# Patient Record
Sex: Male | Born: 1980 | Race: Black or African American | Hispanic: No | Marital: Single | Smoking: Light tobacco smoker
Health system: Southern US, Community
[De-identification: ages and names within clinical notes are randomized; demographics above are authoritative.]

---

## 2017-11-24 ENCOUNTER — Other Ambulatory Visit: Payer: Self-pay

## 2017-11-24 ENCOUNTER — Emergency Department: Payer: Self-pay

## 2017-11-24 ENCOUNTER — Encounter: Payer: Self-pay | Admitting: Emergency Medicine

## 2017-11-24 ENCOUNTER — Emergency Department
Admission: EM | Admit: 2017-11-24 | Discharge: 2017-11-24 | Disposition: A | Discharge status: Home / Self Care | Payer: Self-pay | Attending: Emergency Medicine | Admitting: Emergency Medicine

## 2017-11-24 DIAGNOSIS — F17200 Nicotine dependence, unspecified, uncomplicated: Secondary | ICD-10-CM | POA: Insufficient documentation

## 2017-11-24 DIAGNOSIS — R042 Hemoptysis: Secondary | ICD-10-CM | POA: Insufficient documentation

## 2017-11-24 LAB — URINALYSIS, COMPLETE (UACMP) WITH MICROSCOPIC
BACTERIA UA: NONE SEEN
Bilirubin Urine: NEGATIVE
Glucose, UA: NEGATIVE mg/dL
Hgb urine dipstick: NEGATIVE
Ketones, ur: NEGATIVE mg/dL
Leukocytes, UA: NEGATIVE
Nitrite: NEGATIVE
Protein, ur: NEGATIVE mg/dL
SPECIFIC GRAVITY, URINE: 1.018 (ref 1.005–1.030)
pH: 7 (ref 5.0–8.0)

## 2017-11-24 NOTE — ED Notes (Signed)
Patient returns from radiology to [facility].

## 2017-11-24 NOTE — ED Provider Notes (Signed)
[facility] Emergency Department Provider Note   ____________________________________________    I have reviewed the triage vital signs and the nursing notes.   HISTORY  Chief Complaint Cough and Hematuria     HPI Joe Wolfe is a 36 y.o. male who presents for reevaluation because on a routine physical recently it was noted that he had hematuria on urine dipstick and he was told to follow-up for this.  He is asymptomatic, no flank pain, no dysuria.   In addition he reports that he developed a cough this morning which has been productive and he had a small amount of blood mixed with phlegm after 1 cough.  Denies shortness of breath.  No calf pain swelling history of blood clots.  No recent travel.   History reviewed. No pertinent past medical history.  There are no active problems to display for this patient.   History reviewed. No pertinent surgical history.  Prior to Admission medications   Not on File     Allergies Patient has no known allergies.  No family history on file.  Social History Social History   Tobacco Use  . Smoking status: Light Tobacco Smoker  . Smokeless tobacco: Never Used  Substance Use Topics  . Alcohol use: Yes  . Drug use: No    Review of Systems  Constitutional: No fever/chills Eyes: No visual changes.  ENT: No sore throat. Cardiovascular: Denies chest pain. Respiratory: Denies shortness of breath.  Cough as above Gastrointestinal: No abdominal pain.   Genitourinary: Negative for dysuria. Musculoskeletal: Negative for back pain. Skin: Negative for rash. Neurological: Negative for headaches    ____________________________________________   PHYSICAL EXAM:  VITAL SIGNS: ED Triage Vitals  Enc Vitals Group     BP 11/24/17 1142 115/77     Pulse Rate 11/24/17 1142 70     Resp 11/24/17 1142 20     Temp 11/24/17 1142 97.7 F (36.5 C)     Temp Source 11/24/17 1142 Oral     SpO2 11/24/17  1142 98 %     Weight 11/24/17 1142 65.8 kg (145 lb)     Height --      Head Circumference --      Peak Flow --      Pain Score 11/24/17 1437 2     Pain Loc --      Pain Edu? --      Excl. in GC? --     Constitutional: Alert and oriented. No acute distress. Pleasant and interactive Eyes: Conjunctivae are normal.   Nose: No congestion/rhinnorhea. Mouth/Throat: Mucous membranes are moist.  Pharynx normal Cardiovascular: Normal rate, regular rhythm. Grossly normal heart sounds.  Good peripheral circulation. Respiratory: Normal respiratory effort.  No retractions. Lungs CTAB. Gastrointestinal: Soft and nontender. No distention.  No CVA tenderness. Genitourinary: deferred Musculoskeletal: No lower extremity tenderness nor edema.  Warm and well perfused Neurologic:  Normal speech and language. No gross focal neurologic deficits are appreciated.  Skin:  Skin is warm, dry and intact. No rash noted. Psychiatric: Mood and affect are normal. Speech and behavior are normal.  ____________________________________________   LABS (all labs ordered are listed, but only abnormal results are displayed)  Labs Reviewed  URINALYSIS, COMPLETE (UACMP) WITH MICROSCOPIC - Abnormal; Notable for the following components:      Result Value   Color, Urine YELLOW (*)    APPearance CLEAR (*)    Squamous Epithelial / LPF 0-5 (*)    All other components within normal  limits   ____________________________________________  EKG  None ____________________________________________  RADIOLOGY  Chest x-ray normal ____________________________________________   PROCEDURES  Procedure(s) performed: No  Procedures   Critical Care performed: No ____________________________________________   INITIAL IMPRESSION / ASSESSMENT AND PLAN / ED COURSE  Pertinent labs & imaging results that were available during my care of the patient were reviewed by me and considered in my medical decision making (see chart  for details).  Patient well-appearing in no acute distress.  Vital signs unremarkable.  Exam is reassuring.  No blood on urinalysis.  Chest x-ray is normal.  Suspect viral upper respiratory infection as the cause of his cough.  Recommend supportive care.  Outpatient follow-up as needed.  Return precautions discussed      ____________________________________________   FINAL CLINICAL IMPRESSION(S) / ED DIAGNOSES  Final diagnoses:  Cough with hemoptysis        Note:  This document was prepared using Dragon voice recognition software and may include unintentional dictation errors.    Jene EveryKinner, Gumecindo Hopkin, MD 11/24/17 1524

## 2017-11-24 NOTE — ED Notes (Signed)
Pt reports that he has blood in his urine since last Thursday  Pt reports that he started coughing up blood yesterday Pt reports cough, sore throat, denies fever

## 2017-11-24 NOTE — ED Triage Notes (Signed)
Pt wants to be seen for cough x4 days and blood in urine since last Thursday dx in [place], pt was told to follow up as soon as possible for blood in urine.

## 2017-11-24 NOTE — ED Notes (Signed)
To Radiology via [facility] 

## 2017-11-24 NOTE — ED Notes (Signed)
First Nurse Note:  Patient states he found out he had blood in his urine that was found last week during a physical.  States he began coughing up bright red blood yesterday.  Here to be seen for both.

## 2019-07-24 IMAGING — CR DG CHEST 2V
1 series · 2 of 2 positions shown · non-contrast
Comparison: None.

CLINICAL DATA: Cough

EXAM:
CHEST  2 VIEW

[Series 1: dg chest 2 view · 0.14mm/px · 2 of 2 slices shown]
[im 1/2]
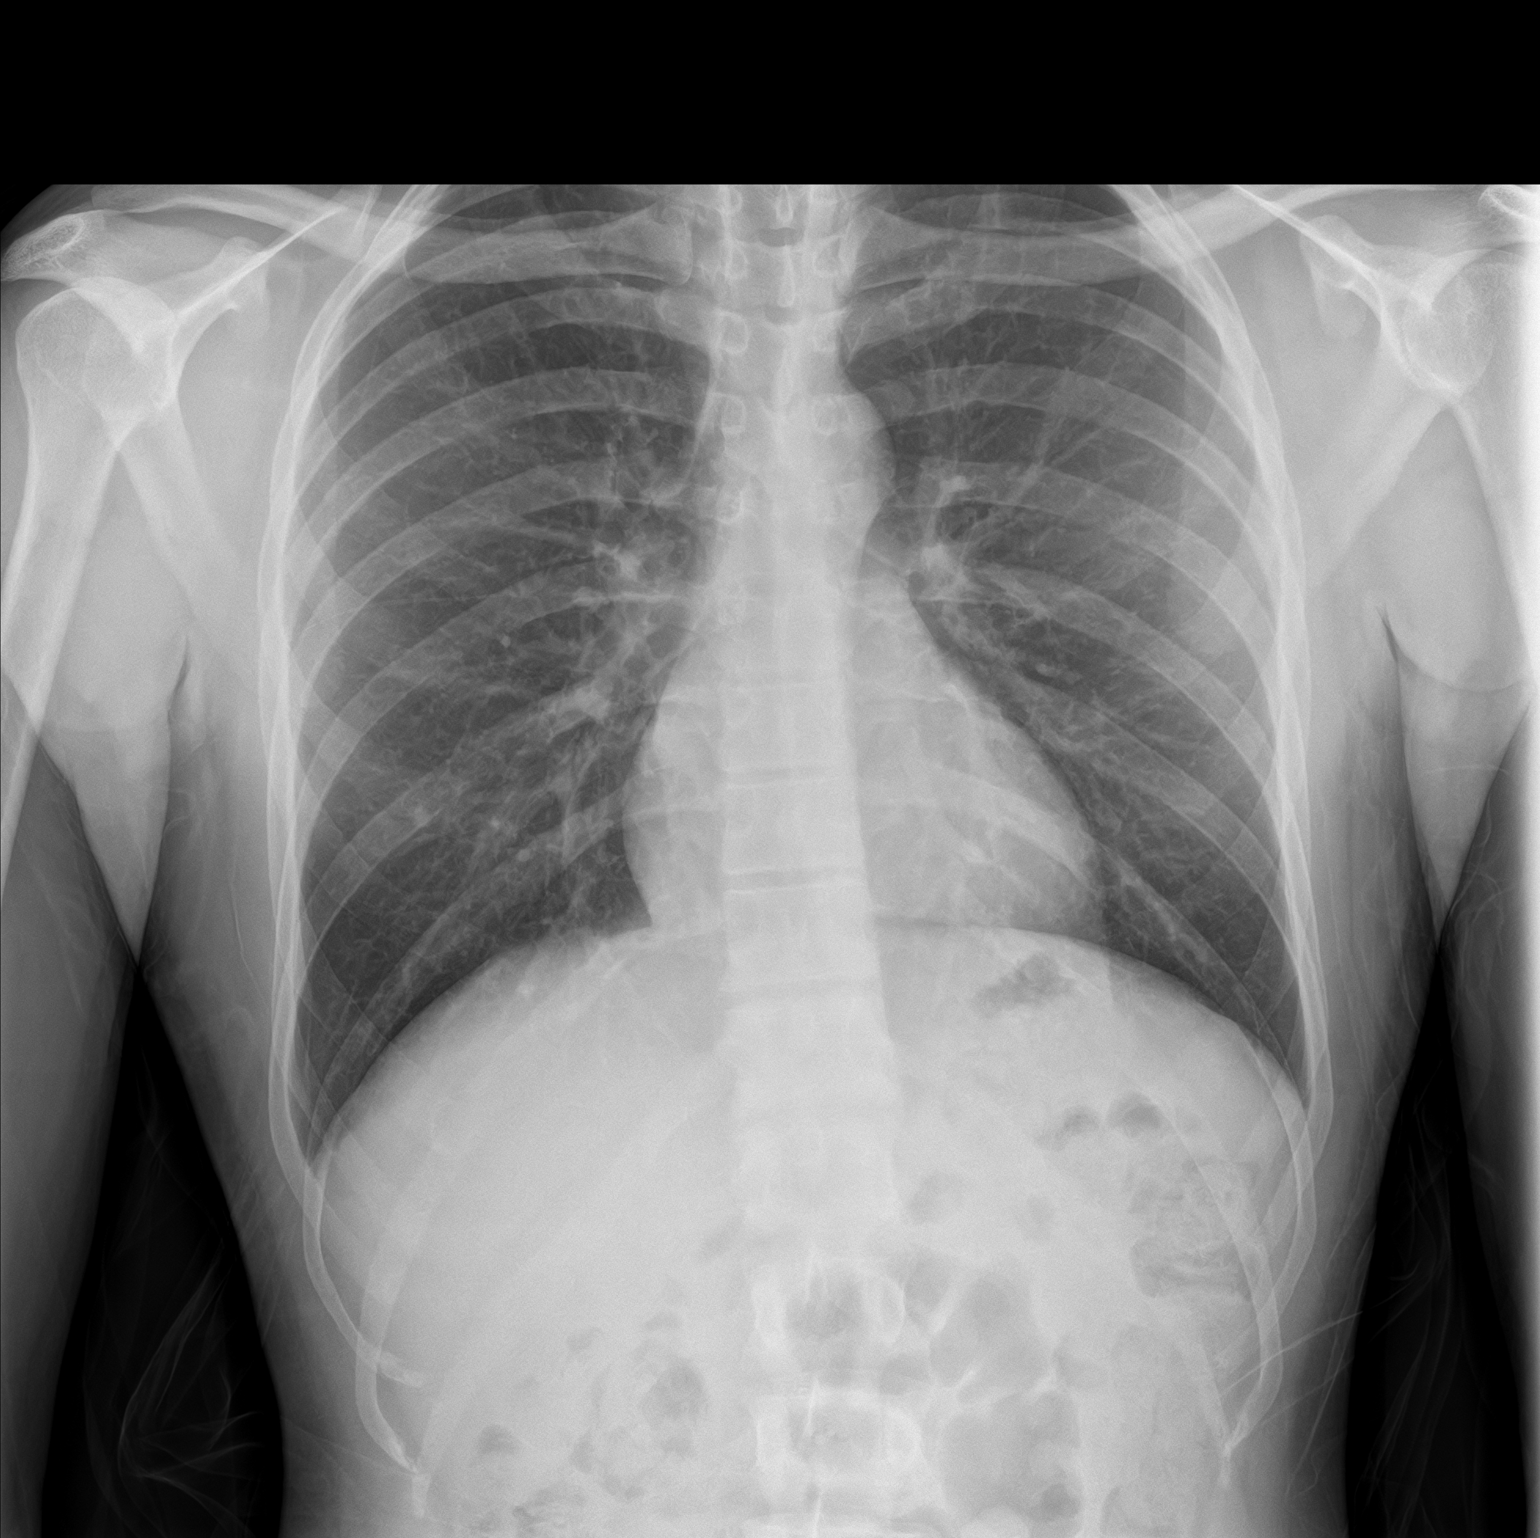
[im 2/2]
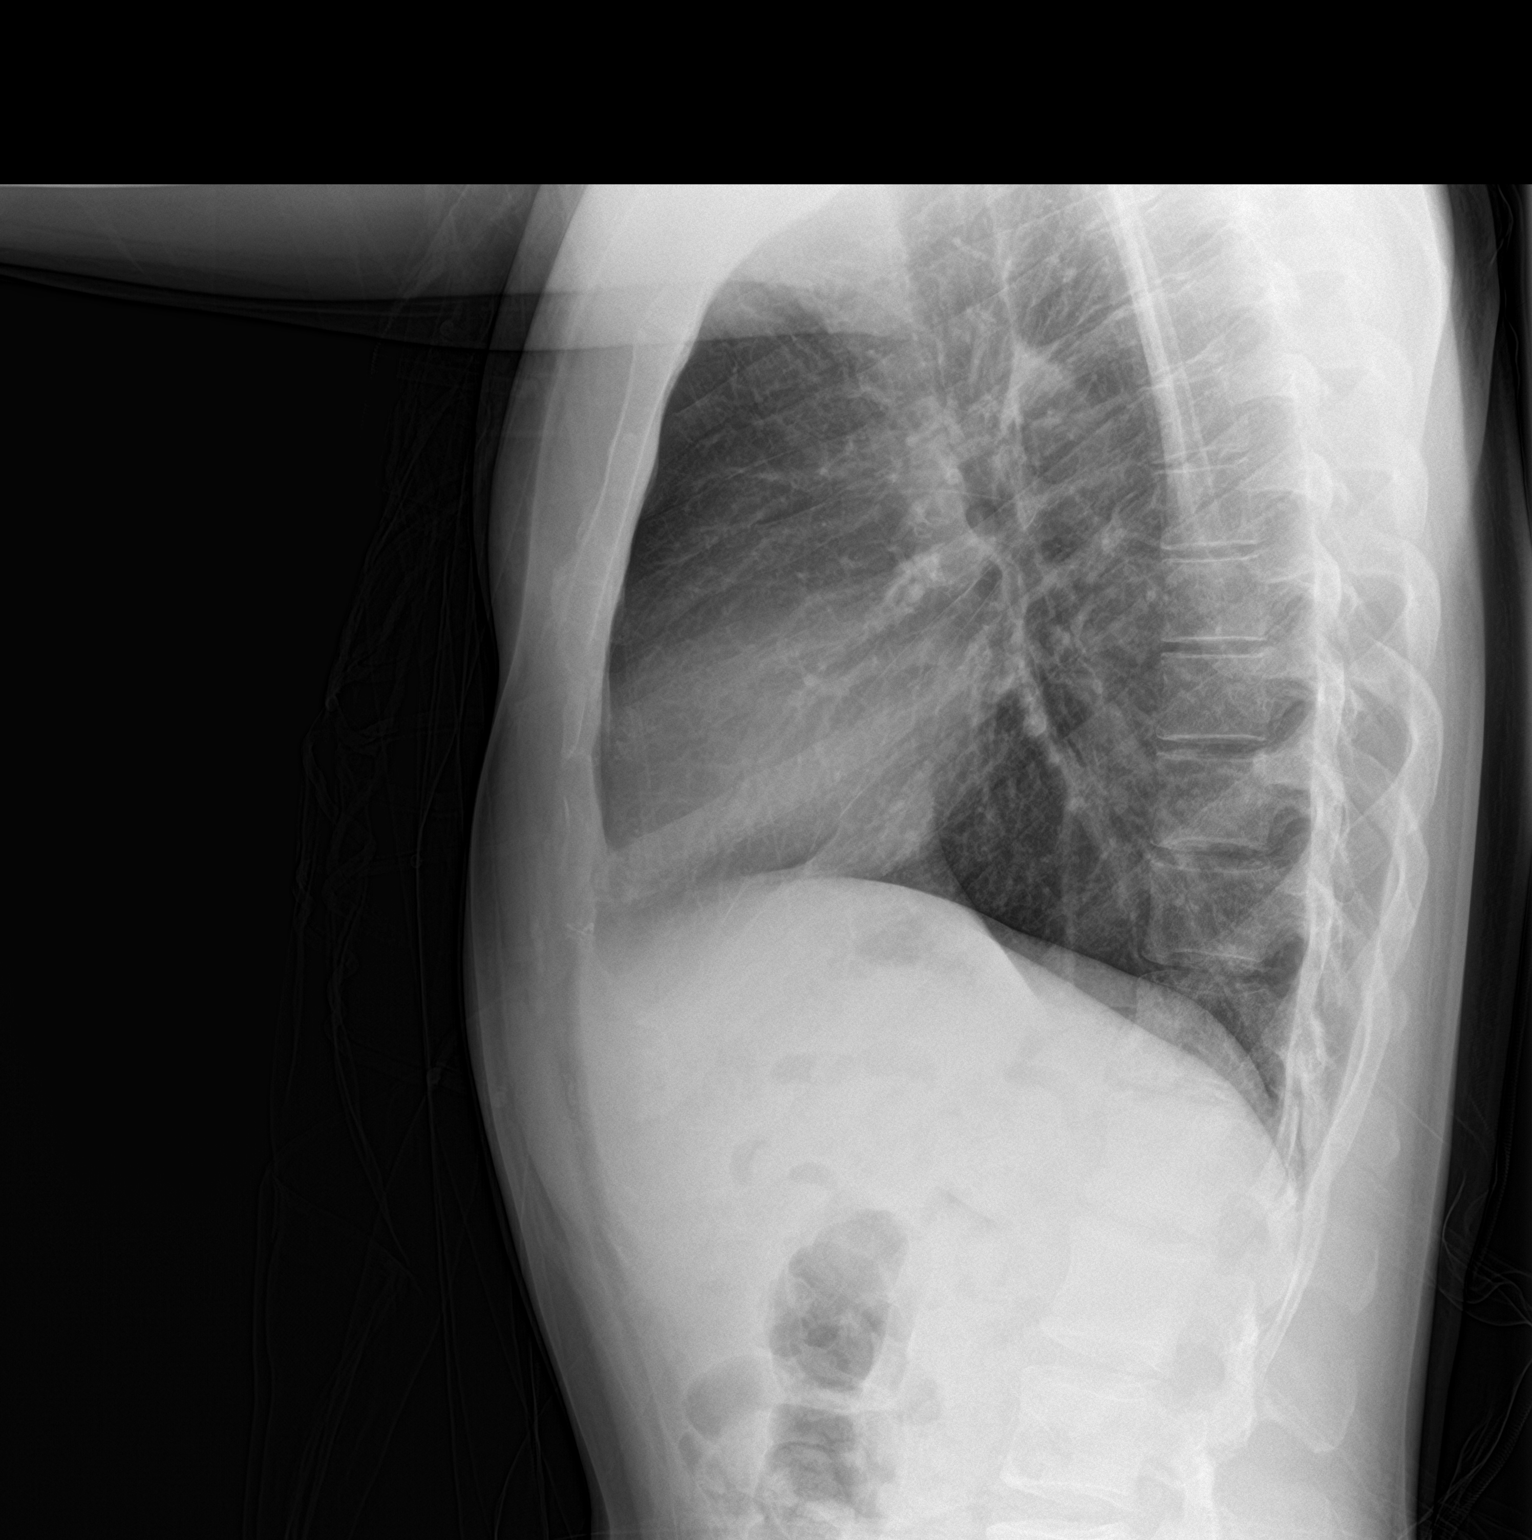

[2 of 2 positions shown; findings below may reference images not displayed]

FINDINGS: Lungs are clear. Heart size and pulmonary vascularity are normal. No
adenopathy. No bone lesions.
IMPRESSION: No edema or consolidation.
